# Patient Record
Sex: Female | Born: 2000 | Race: White | Hispanic: No | Marital: Single | Smoking: Never smoker
Health system: Southern US, Community
[De-identification: ages and names within clinical notes are randomized; demographics above are authoritative.]

## PROBLEM LIST (undated history)

## (undated) DIAGNOSIS — F909 Attention-deficit hyperactivity disorder, unspecified type: Secondary | ICD-10-CM

## (undated) DIAGNOSIS — R062 Wheezing: Secondary | ICD-10-CM

## (undated) HISTORY — DX: Attention-deficit hyperactivity disorder, unspecified type: F90.9

## (undated) HISTORY — PX: TONSILECTOMY, ADENOIDECTOMY, BILATERAL MYRINGOTOMY AND TUBES: SHX2538

---
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
## 2022-11-07 DIAGNOSIS — F4320 Adjustment disorder, unspecified: Secondary | ICD-10-CM | POA: Diagnosis not present

## 2022-11-22 DIAGNOSIS — F4320 Adjustment disorder, unspecified: Secondary | ICD-10-CM | POA: Diagnosis not present

## 2022-11-28 DIAGNOSIS — F4320 Adjustment disorder, unspecified: Secondary | ICD-10-CM | POA: Diagnosis not present

## 2022-12-03 DIAGNOSIS — F4320 Adjustment disorder, unspecified: Secondary | ICD-10-CM | POA: Diagnosis not present

## 2022-12-14 DIAGNOSIS — F4320 Adjustment disorder, unspecified: Secondary | ICD-10-CM | POA: Diagnosis not present

## 2023-01-16 DIAGNOSIS — F4320 Adjustment disorder, unspecified: Secondary | ICD-10-CM | POA: Diagnosis not present

## 2023-01-17 DIAGNOSIS — F4320 Adjustment disorder, unspecified: Secondary | ICD-10-CM | POA: Diagnosis not present

## 2023-02-06 DIAGNOSIS — Z113 Encounter for screening for infections with a predominantly sexual mode of transmission: Secondary | ICD-10-CM | POA: Diagnosis not present

## 2023-02-06 DIAGNOSIS — G43909 Migraine, unspecified, not intractable, without status migrainosus: Secondary | ICD-10-CM | POA: Diagnosis not present

## 2023-02-06 DIAGNOSIS — Z124 Encounter for screening for malignant neoplasm of cervix: Secondary | ICD-10-CM | POA: Diagnosis not present

## 2023-02-06 DIAGNOSIS — Z01419 Encounter for gynecological examination (general) (routine) without abnormal findings: Secondary | ICD-10-CM | POA: Diagnosis not present

## 2023-02-06 DIAGNOSIS — N898 Other specified noninflammatory disorders of vagina: Secondary | ICD-10-CM | POA: Diagnosis not present

## 2023-02-15 DIAGNOSIS — F4320 Adjustment disorder, unspecified: Secondary | ICD-10-CM | POA: Diagnosis not present

## 2023-02-23 DIAGNOSIS — F4320 Adjustment disorder, unspecified: Secondary | ICD-10-CM | POA: Diagnosis not present

## 2023-03-13 DIAGNOSIS — F4320 Adjustment disorder, unspecified: Secondary | ICD-10-CM | POA: Diagnosis not present

## 2023-04-04 DIAGNOSIS — F4320 Adjustment disorder, unspecified: Secondary | ICD-10-CM | POA: Diagnosis not present

## 2023-05-09 DIAGNOSIS — G43909 Migraine, unspecified, not intractable, without status migrainosus: Secondary | ICD-10-CM | POA: Diagnosis not present

## 2023-05-17 DIAGNOSIS — S61411A Laceration without foreign body of right hand, initial encounter: Secondary | ICD-10-CM | POA: Diagnosis not present

## 2023-05-17 DIAGNOSIS — Z23 Encounter for immunization: Secondary | ICD-10-CM | POA: Diagnosis not present

## 2023-05-17 DIAGNOSIS — S61412A Laceration without foreign body of left hand, initial encounter: Secondary | ICD-10-CM | POA: Diagnosis not present

## 2023-07-01 DIAGNOSIS — J45909 Unspecified asthma, uncomplicated: Secondary | ICD-10-CM | POA: Diagnosis not present

## 2023-07-01 DIAGNOSIS — G43909 Migraine, unspecified, not intractable, without status migrainosus: Secondary | ICD-10-CM | POA: Diagnosis not present

## 2023-07-01 DIAGNOSIS — Z131 Encounter for screening for diabetes mellitus: Secondary | ICD-10-CM | POA: Diagnosis not present

## 2023-07-01 DIAGNOSIS — E7800 Pure hypercholesterolemia, unspecified: Secondary | ICD-10-CM | POA: Diagnosis not present

## 2023-07-01 DIAGNOSIS — Z0000 Encounter for general adult medical examination without abnormal findings: Secondary | ICD-10-CM | POA: Diagnosis not present

## 2023-07-01 DIAGNOSIS — F900 Attention-deficit hyperactivity disorder, predominantly inattentive type: Secondary | ICD-10-CM | POA: Diagnosis not present

## 2023-08-04 DIAGNOSIS — F4320 Adjustment disorder, unspecified: Secondary | ICD-10-CM | POA: Diagnosis not present

## 2023-09-08 DIAGNOSIS — F4320 Adjustment disorder, unspecified: Secondary | ICD-10-CM | POA: Diagnosis not present

## 2023-11-03 DIAGNOSIS — F4320 Adjustment disorder, unspecified: Secondary | ICD-10-CM | POA: Diagnosis not present

## 2023-11-25 DIAGNOSIS — F4320 Adjustment disorder, unspecified: Secondary | ICD-10-CM | POA: Diagnosis not present

## 2023-12-19 DIAGNOSIS — F4320 Adjustment disorder, unspecified: Secondary | ICD-10-CM | POA: Diagnosis not present

## 2023-12-20 DIAGNOSIS — F4320 Adjustment disorder, unspecified: Secondary | ICD-10-CM | POA: Diagnosis not present

## 2023-12-25 DIAGNOSIS — G43909 Migraine, unspecified, not intractable, without status migrainosus: Secondary | ICD-10-CM | POA: Diagnosis not present

## 2023-12-25 DIAGNOSIS — R739 Hyperglycemia, unspecified: Secondary | ICD-10-CM | POA: Diagnosis not present

## 2023-12-25 DIAGNOSIS — E7800 Pure hypercholesterolemia, unspecified: Secondary | ICD-10-CM | POA: Diagnosis not present

## 2023-12-25 DIAGNOSIS — F900 Attention-deficit hyperactivity disorder, predominantly inattentive type: Secondary | ICD-10-CM | POA: Diagnosis not present

## 2023-12-25 DIAGNOSIS — J45909 Unspecified asthma, uncomplicated: Secondary | ICD-10-CM | POA: Diagnosis not present

## 2024-02-12 DIAGNOSIS — N898 Other specified noninflammatory disorders of vagina: Secondary | ICD-10-CM | POA: Diagnosis not present

## 2024-02-12 DIAGNOSIS — F4320 Adjustment disorder, unspecified: Secondary | ICD-10-CM | POA: Diagnosis not present

## 2024-03-17 DIAGNOSIS — Z01419 Encounter for gynecological examination (general) (routine) without abnormal findings: Secondary | ICD-10-CM | POA: Diagnosis not present

## 2024-03-17 DIAGNOSIS — Z113 Encounter for screening for infections with a predominantly sexual mode of transmission: Secondary | ICD-10-CM | POA: Diagnosis not present
